# Patient Record
Sex: Female | Born: 1975 | Race: Black or African American | Hispanic: No | Marital: Married | Smoking: Never smoker
Health system: Southern US, Community
[De-identification: ages and names within clinical notes are randomized; demographics above are authoritative.]

## PROBLEM LIST (undated history)

## (undated) ENCOUNTER — Ambulatory Visit (HOSPITAL_COMMUNITY): Admission: EM | Payer: Self-pay | Source: Home / Self Care

---

## 2001-08-20 ENCOUNTER — Inpatient Hospital Stay (HOSPITAL_COMMUNITY): Admission: AD | Admit: 2001-08-20 | Discharge: 2001-08-23 | Payer: Self-pay | Admitting: Obstetrics and Gynecology

## 2001-09-21 ENCOUNTER — Other Ambulatory Visit: Admission: RE | Admit: 2001-09-21 | Discharge: 2001-09-21 | Payer: Self-pay | Admitting: Obstetrics & Gynecology

## 2002-11-08 ENCOUNTER — Other Ambulatory Visit: Admission: RE | Admit: 2002-11-08 | Discharge: 2002-11-08 | Payer: Self-pay | Admitting: Obstetrics & Gynecology

## 2003-11-14 ENCOUNTER — Other Ambulatory Visit: Admission: RE | Admit: 2003-11-14 | Discharge: 2003-11-14 | Payer: Self-pay | Admitting: Obstetrics and Gynecology

## 2003-11-14 ENCOUNTER — Ambulatory Visit (HOSPITAL_COMMUNITY): Admission: RE | Admit: 2003-11-14 | Discharge: 2003-11-14 | Payer: Self-pay | Admitting: Obstetrics and Gynecology

## 2004-06-07 ENCOUNTER — Inpatient Hospital Stay (HOSPITAL_COMMUNITY): Admission: AD | Admit: 2004-06-07 | Discharge: 2004-06-10 | Payer: Self-pay | Admitting: Obstetrics & Gynecology

## 2007-05-15 ENCOUNTER — Emergency Department (HOSPITAL_COMMUNITY): Admission: EM | Admit: 2007-05-15 | Discharge: 2007-05-15 | Payer: Self-pay | Admitting: Emergency Medicine

## 2008-03-07 ENCOUNTER — Emergency Department (HOSPITAL_COMMUNITY): Admission: EM | Admit: 2008-03-07 | Discharge: 2008-03-07 | Payer: Self-pay | Admitting: Emergency Medicine

## 2008-09-11 ENCOUNTER — Inpatient Hospital Stay (HOSPITAL_COMMUNITY): Admission: AD | Admit: 2008-09-11 | Discharge: 2008-09-11 | Payer: Self-pay | Admitting: Obstetrics and Gynecology

## 2008-09-15 ENCOUNTER — Inpatient Hospital Stay (HOSPITAL_COMMUNITY): Admission: AD | Admit: 2008-09-15 | Discharge: 2008-09-15 | Payer: Self-pay | Admitting: Obstetrics & Gynecology

## 2008-09-19 ENCOUNTER — Inpatient Hospital Stay (HOSPITAL_COMMUNITY): Admission: AD | Admit: 2008-09-19 | Discharge: 2008-09-21 | Payer: Self-pay | Admitting: Obstetrics and Gynecology

## 2008-09-20 ENCOUNTER — Encounter (INDEPENDENT_AMBULATORY_CARE_PROVIDER_SITE_OTHER): Payer: Self-pay | Admitting: Obstetrics and Gynecology

## 2009-10-30 ENCOUNTER — Emergency Department (HOSPITAL_COMMUNITY): Admission: EM | Admit: 2009-10-30 | Discharge: 2009-10-30 | Payer: Self-pay | Admitting: Emergency Medicine

## 2010-06-03 ENCOUNTER — Ambulatory Visit (HOSPITAL_COMMUNITY)
Admission: RE | Admit: 2010-06-03 | Discharge: 2010-06-03 | Disposition: A | Discharge status: Home / Self Care | Payer: BC Managed Care – PPO | Source: Ambulatory Visit | Attending: Obstetrics and Gynecology | Admitting: Obstetrics and Gynecology

## 2010-06-03 ENCOUNTER — Other Ambulatory Visit (HOSPITAL_COMMUNITY): Payer: Self-pay | Admitting: Obstetrics and Gynecology

## 2010-06-03 DIAGNOSIS — K312 Hourglass stricture and stenosis of stomach: Secondary | ICD-10-CM

## 2010-06-03 DIAGNOSIS — R1011 Right upper quadrant pain: Secondary | ICD-10-CM | POA: Insufficient documentation

## 2010-06-20 LAB — CBC
HCT: 34.8 % — ABNORMAL LOW (ref 36.0–46.0)
MCH: 28.5 pg (ref 26.0–34.0)
MCHC: 33 g/dL (ref 30.0–36.0)
MCV: 86.6 fL (ref 78.0–100.0)
RBC: 4.02 MIL/uL (ref 3.87–5.11)

## 2010-06-20 LAB — DIFFERENTIAL
Eosinophils Relative: 4 % (ref 0–5)
Monocytes Absolute: 0.8 10*3/uL (ref 0.1–1.0)
Monocytes Relative: 10 % (ref 3–12)

## 2010-06-20 LAB — URINE CULTURE
Colony Count: >=100000
Culture  Setup Time: 201107282029

## 2010-06-20 LAB — POCT URINALYSIS DIP (DEVICE)
Bilirubin Urine: NEGATIVE
Nitrite: POSITIVE — AB
Urobilinogen, UA: 0.2 mg/dL (ref 0.0–1.0)
pH: 6 (ref 5.0–8.0)

## 2010-06-20 LAB — POCT PREGNANCY, URINE: Preg Test, Ur: NEGATIVE

## 2010-07-13 LAB — CBC
HCT: 33.2 % — ABNORMAL LOW (ref 36.0–46.0)
HCT: 35.7 % — ABNORMAL LOW (ref 36.0–46.0)
Hemoglobin: 11.2 g/dL — ABNORMAL LOW (ref 12.0–15.0)
Hemoglobin: 11.9 g/dL — ABNORMAL LOW (ref 12.0–15.0)
MCHC: 33.3 g/dL (ref 30.0–36.0)
MCV: 89.2 fL (ref 78.0–100.0)
MCV: 89.3 fL (ref 78.0–100.0)
Platelets: 213 10*3/uL (ref 150–400)
RBC: 3.71 MIL/uL — ABNORMAL LOW (ref 3.87–5.11)
RBC: 4 MIL/uL (ref 3.87–5.11)
WBC: 11.4 10*3/uL — ABNORMAL HIGH (ref 4.0–10.5)

## 2010-07-13 LAB — RPR: RPR Ser Ql: NONREACTIVE

## 2010-08-18 NOTE — Op Note (Signed)
NAME:  Bailey Keller, MATZKE NO.:  [id number]   MEDICAL RECORD NO.:  [id number]          PATIENT TYPE:  INP   LOCATION:  9136                          FACILITY:  [facility]   PHYSICIAN:  Gerrit Friends. Aldona Bar, M.D.   DATE OF BIRTH:  1975-12-18   DATE OF PROCEDURE:  09/20/2008  DATE OF DISCHARGE:                               OPERATIVE REPORT   PREOPERATIVE DIAGNOSIS:  Desire for permanent elective sterilization -  postpartum.   POSTOPERATIVE DIAGNOSIS:  Desire for permanent elective sterilization -  postpartum.   PATHOLOGY:  Pending.   PROCEDURE:  Postpartum tubal sterilization procedure.   SURGEON:  Gerrit Friends. Aldona Bar, MD   ANESTHESIA:  General endotracheal.   HISTORY:  This 35 year old patient delivered on June 17, had requested a  permanent elective sterilization procedure postpartum while she was  pregnant.  Appropriate forms were signed.  Appropriate discussion was  carried out.  She is now taken to the operating according to her wishes  for postpartum tubal sterilization procedure being aware that such  procedure is meant to be a 100% permanent, but unfortunately is not a  100% perfect - subsequent pregnancy can result.   The patient was taken to the operating room after satisfactory induction  of general endotracheal anesthesia.  She was prepped and draped in usual  fashion with a Foley catheter placed as well.  Once the patient was  adequately draped, procedure was begun.   Using Allis clamps, subumbilical area was elevated and the skin incision  measuring approximately 2-1/2-3 cm was made.  Thereafter using  Metzenbaum scissors, peritoneum and fascia was appropriately opened.  The patient was placed in Trendelenburg position.  The fundus of the  uterus felt normal.  Both tubes and ovaries felt normal.  It was  possible to elevate the right fallopian tube, traced out, fimbriated for  positive implication, and then in the midportion of the right fallopian  tube the  knuckle was created and a single tie of #1 plain catgut suture  tied about the knuckle and the knuckle excised and sent to pathology.  Hemostasis was adequate.  A similar procedure was carried out on the  left fallopian tube.  At this time with good hemostasis of both tubal  ligation sites, no intra-abdominal pathology appreciated.  All  instruments were removed and closure of the abdomen was begun in layers.  The abdominal peritoneum was closed with 0 Vicryl in a running fashion.  The fascia was closed with 0 Vicryl in a interrupted fashion.  Skin was  closed with 3-0 Vicryl in subcuticular continuous fashion.  Band-Aid was  applied.  The patient at  this time was awakened and transported to the recovery room in  satisfactory addition having tolerated the procedure well.  Estimated  blood loss negligible.  All counts correct x2.  Pathologic specimen  consisted with a segment of each fallopian tube.      Gerrit Friends. Aldona Bar, M.D.  Electronically Signed     RMW/MEDQ  D:  09/20/2008  T:  09/21/2008  Job:  [id number]

## 2010-08-18 NOTE — Discharge Summary (Signed)
NAME:  Bailey Keller, Bailey Keller NO.:  [id number]   MEDICAL RECORD NO.:  [id number]          PATIENT TYPE:  INP   LOCATION:  9136                          FACILITY:  [facility]   PHYSICIAN:  Gerrit Friends. Aldona Bar, M.D.   DATE OF BIRTH:  15-Sep-1975   DATE OF ADMISSION:  09/19/2008  DATE OF DISCHARGE:  09/21/2008                               DISCHARGE SUMMARY   DISCHARGE DIAGNOSES:  1. Term pregnancy, delivered 7 pounds 7 ounce female infant, Apgars 9      and 9.  2. Blood type O+.  3. Induction of labor.  4. Desire for permanent elective sterilization.   PROCEDURES:  1. Normal spontaneous delivery.  2. Postpartum tubal sterilization procedure.   SUMMARY:  This patient, is 35 year old, gravida 4, now para 3, abortus 1  was admitted at 70 weeks with a favorable cervix for induction by Dr.  Henderson Cloud.  She delivered on the afternoon of June 17 - had a viable female  weighing 7 pounds 7 ounces with Apgars of 9 and 9 over intact perineum.  She requested a permanent elective sterilization procedure - this was  discussed both antenatally and after delivery.  Appropriate Medicaid  forms were signed and she was taken to the operating room, where under  general endotracheal anesthesia, she had a postpartum tubal  sterilization procedure on the morning of June 18.  On the morning of  June 19, she was ambulating well, tolerating a regular diet well, having  normal bowel and bladder function.  Her incision was clean and dry.  Her  fundus was firm.  Vital signs were stable and she was desirous of  discharge.  She was discharged to home with appropriate instructions and  prescription for Tylox to use 1-2 every 4-6 hours as needed for severe  pain.  She will finish up her vitamins.  Her discharge hemoglobin was  11.2 with a white count of 11,400 and platelet count of 196,000.   Circumcision was not done during the hospital admission since the  patient discovered that her primary insurance was now  Medicaid - she was  under the impression that she still had her [employer] and [employer],  but that was not the case.  Therefore, arrangements to the hospital  could not be made by the patient to allow circumcision while the patient  was hospitalized.  She will contact Dr. Arlyce Dice early next week to see if  the procedure can be done in the office.   CONDITION ON DISCHARGE:  Improved.      Gerrit Friends. Aldona Bar, M.D.  Electronically Signed     RMW/MEDQ  D:  09/21/2008  T:  09/21/2008  Job:  [id number]

## 2010-12-10 ENCOUNTER — Other Ambulatory Visit: Payer: Self-pay | Admitting: Obstetrics and Gynecology

## 2011-01-08 LAB — DIFFERENTIAL
Basophils Absolute: 0 10*3/uL (ref 0.0–0.1)
Basophils Relative: 0 % (ref 0–1)
Eosinophils Absolute: 0.2 10*3/uL (ref 0.0–0.7)
Eosinophils Relative: 2 % (ref 0–5)
Lymphocytes Relative: 9 % — ABNORMAL LOW (ref 12–46)
Neutrophils Relative %: 84 % — ABNORMAL HIGH (ref 43–77)

## 2011-01-08 LAB — COMPREHENSIVE METABOLIC PANEL
ALT: 16 U/L (ref 0–35)
Albumin: 3.8 g/dL (ref 3.5–5.2)
CO2: 23 mEq/L (ref 19–32)
Calcium: 9.3 mg/dL (ref 8.4–10.5)
Chloride: 105 mEq/L (ref 96–112)
Creatinine, Ser: 0.48 mg/dL (ref 0.4–1.2)
GFR calc non Af Amer: >60 mL/min (ref >60)
Glucose, Bld: 93 mg/dL (ref 70–99)
Potassium: 4.4 mEq/L (ref 3.5–5.1)
Total Protein: 7 g/dL (ref 6.0–8.3)

## 2011-01-08 LAB — URINALYSIS, ROUTINE W REFLEX MICROSCOPIC
Bilirubin Urine: NEGATIVE
Glucose, UA: NEGATIVE mg/dL
Ketones, ur: 15 mg/dL — AB
Nitrite: NEGATIVE
Protein, ur: NEGATIVE mg/dL
Urobilinogen, UA: 0.2 mg/dL (ref 0.0–1.0)

## 2011-01-08 LAB — GC/CHLAMYDIA PROBE AMP, GENITAL: GC Probe Amp, Genital: NEGATIVE

## 2011-01-08 LAB — CBC
HCT: 36.7 % (ref 36.0–46.0)
MCV: 86.4 fL (ref 78.0–100.0)
RBC: 4.25 MIL/uL (ref 3.87–5.11)

## 2011-01-08 LAB — WET PREP, GENITAL: Clue Cells Wet Prep HPF POC: NONE SEEN

## 2011-01-08 LAB — URINE MICROSCOPIC-ADD ON

## 2011-08-24 IMAGING — US US ABDOMEN COMPLETE
1 series · 14 of 25 positions shown · non-contrast
Comparison: OB ultrasound 11/14/2003

CLINICAL DATA: Right upper quadrant pain.  History of
pyelonephritis.

ABDOMINAL ULTRASOUND COMPLETE

[Series 1: us abdomen complete · 14 of 67 slices shown]
[im 1/67]
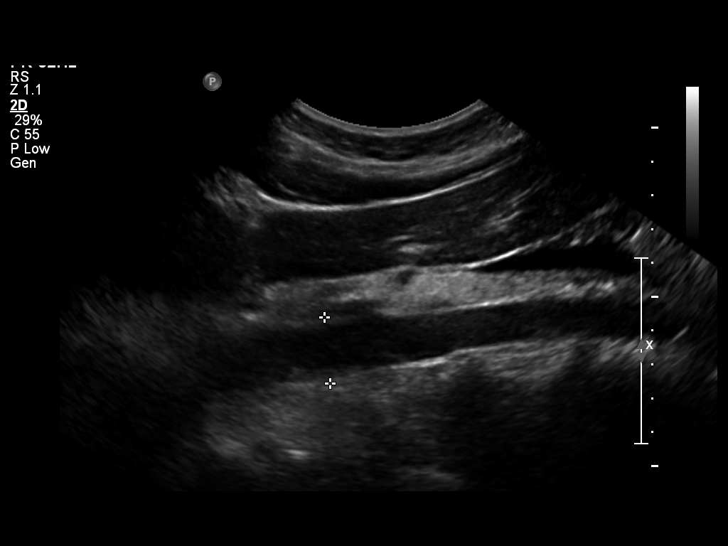
[im 6/67]
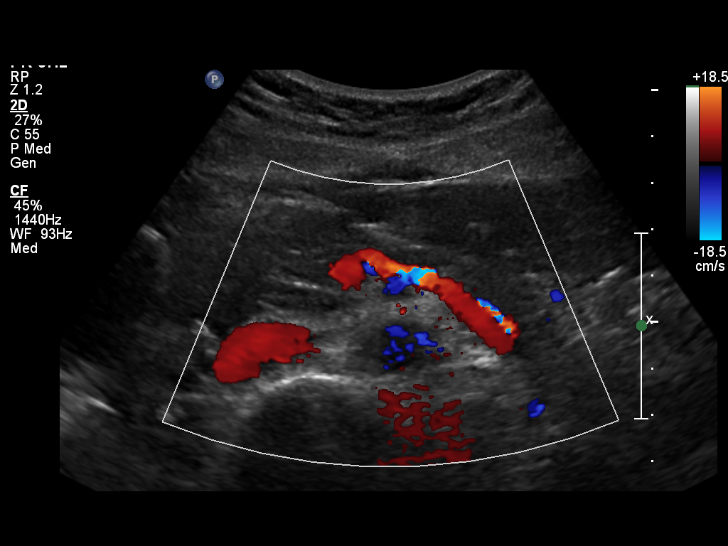
[im 12/67]
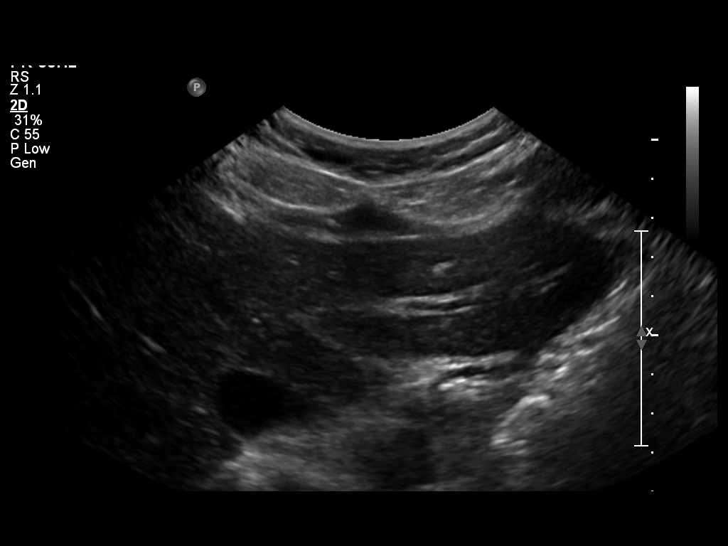
[im 17/67]
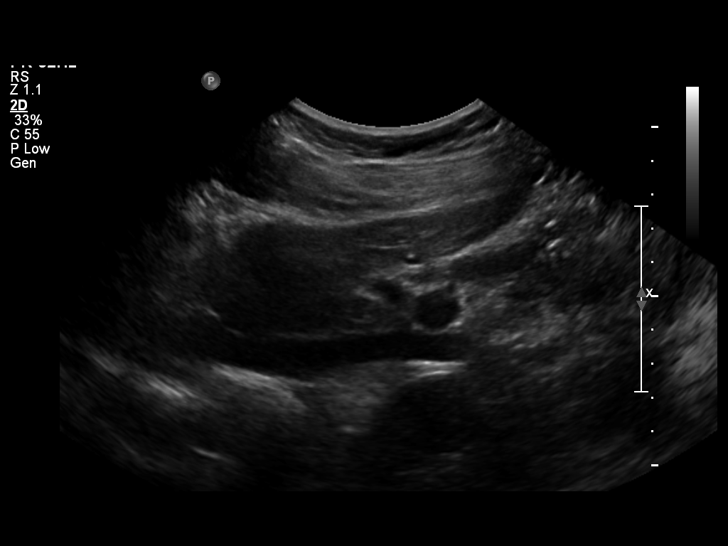
[im 23/67]
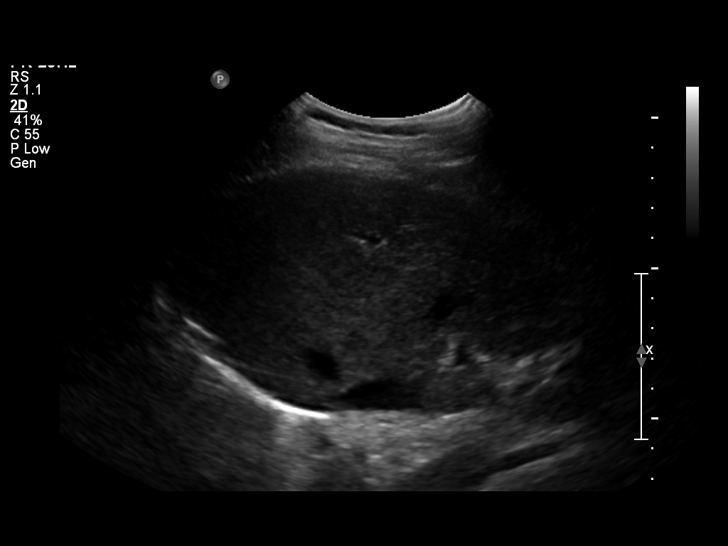
[im 25/67]
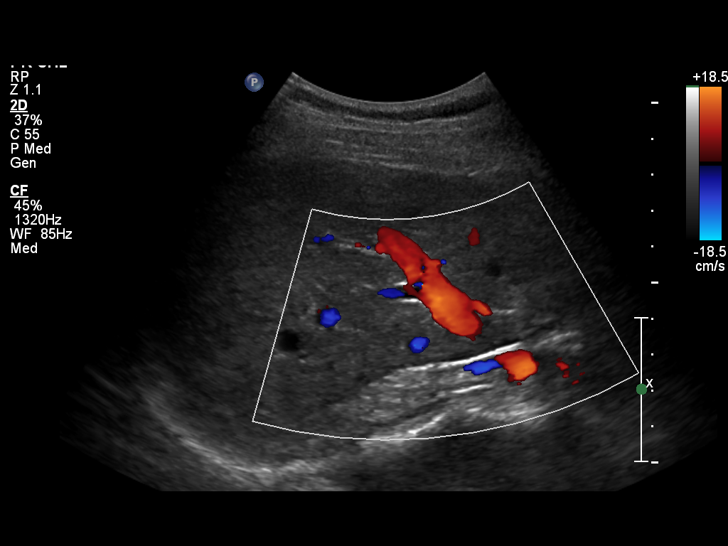
[im 31/67]
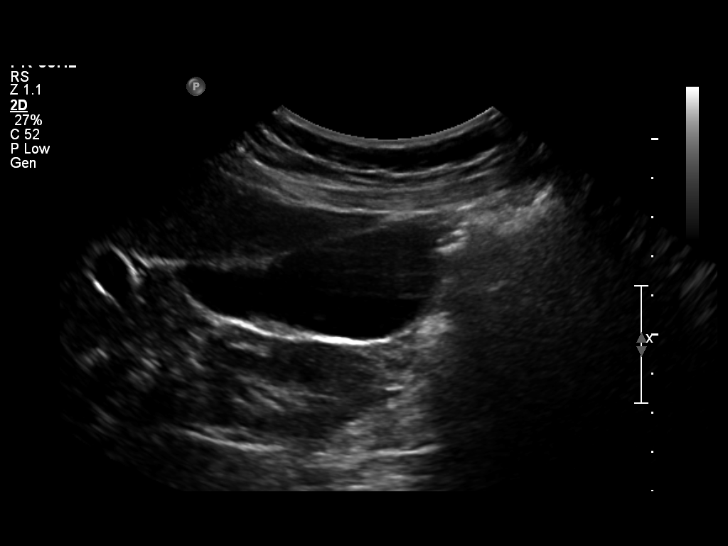
[im 36/67]
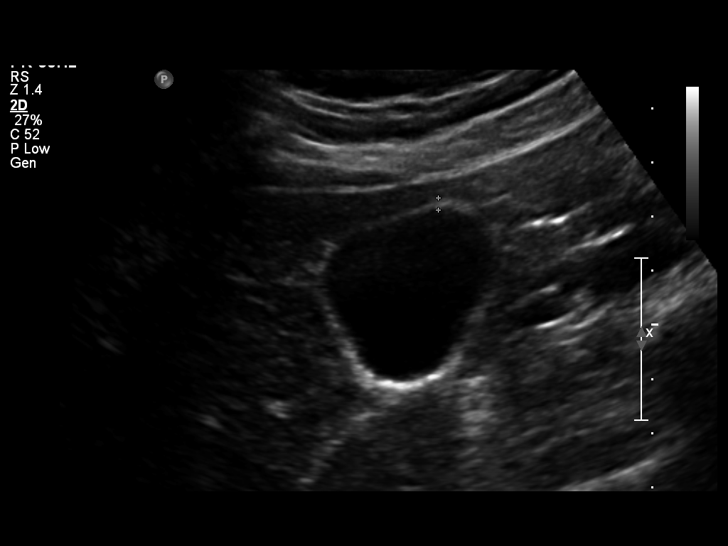
[im 42/67]
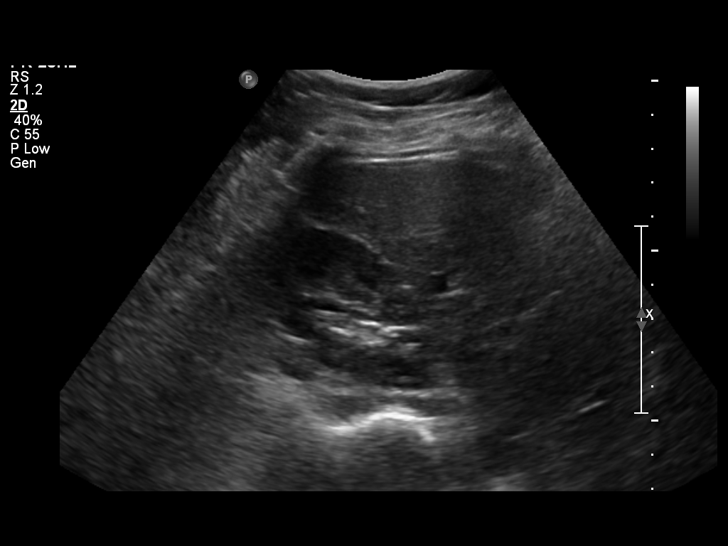
[im 45/67]
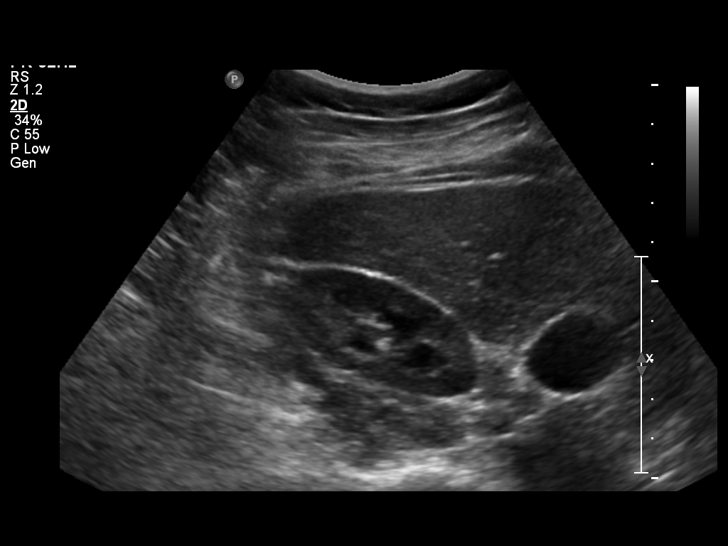
[im 50/67]
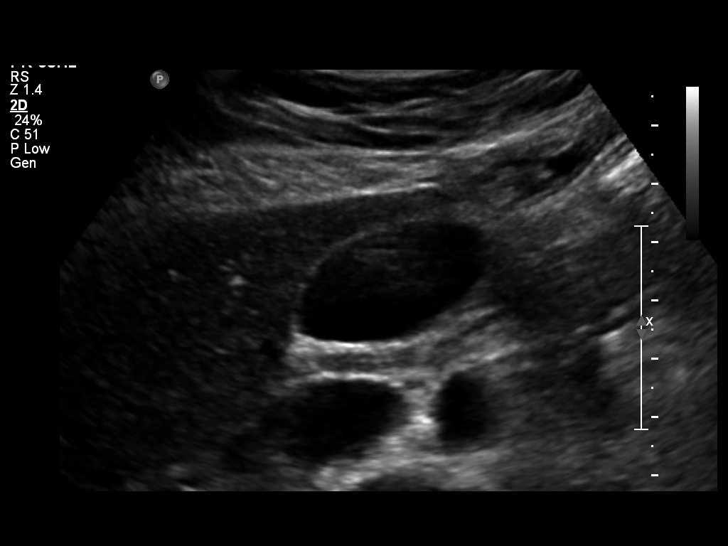
[im 56/67]
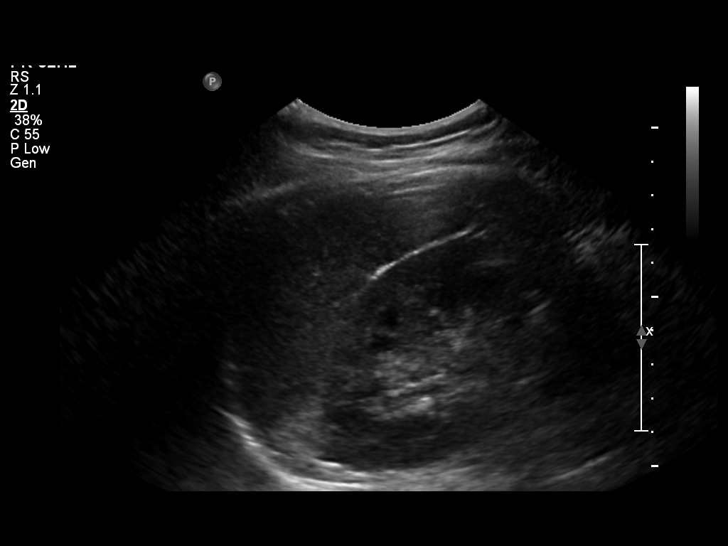
[im 61/67]
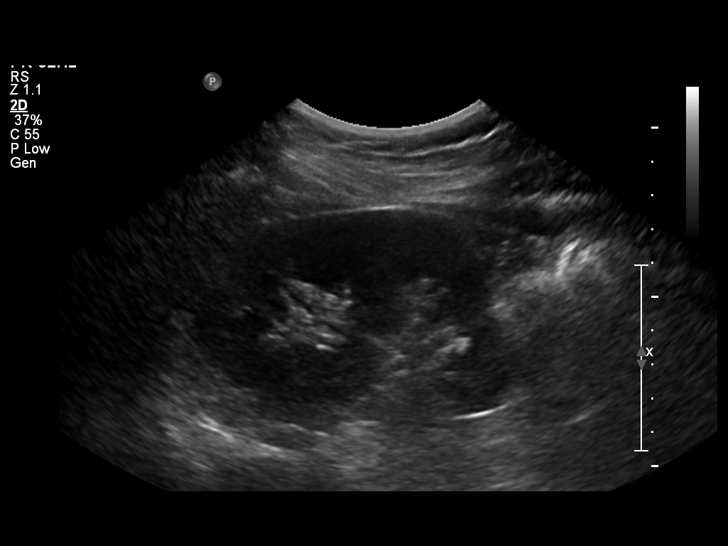
[im 67/67]
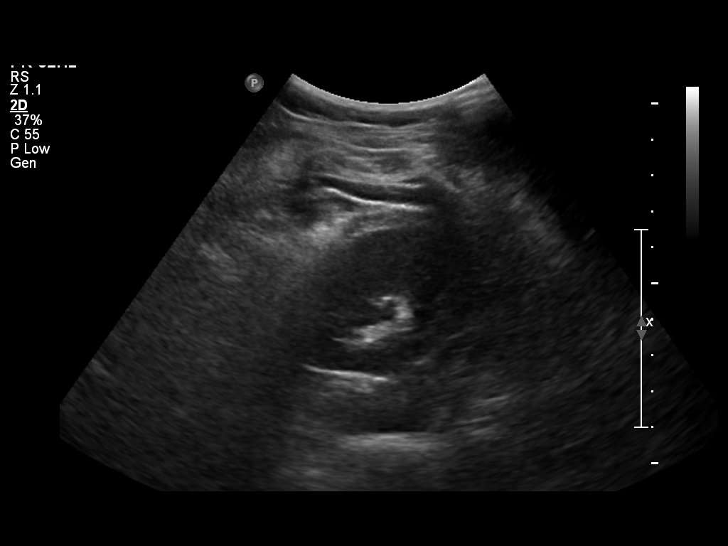

[14 of 25 positions shown; findings below may reference images not displayed]

FINDINGS: Gallbladder:  No gallstones, gallbladder wall thickening, or
pericholecystic fluid.

Common Bile Duct:  Within normal limits in caliber.

Liver: No focal mass lesion identified.  Within normal limits in
parenchymal echogenicity.

IVC:  Appears normal.

Pancreas:  No abnormality identified.

Spleen:  Within normal limits in size and echotexture.

Right kidney:  Normal in size and parenchymal echogenicity.  No
evidence of mass or hydronephrosis.

Left kidney:  Normal in size and parenchymal echogenicity.  No
evidence of mass or hydronephrosis.

Abdominal Aorta:  No aneurysm identified.
IMPRESSION: Negative abdominal ultrasound.

## 2012-10-09 ENCOUNTER — Other Ambulatory Visit: Payer: Self-pay | Admitting: Obstetrics and Gynecology

## 2013-10-11 ENCOUNTER — Other Ambulatory Visit: Payer: Self-pay | Admitting: Obstetrics and Gynecology

## 2013-10-15 LAB — CYTOLOGY - PAP

## 2014-11-04 ENCOUNTER — Other Ambulatory Visit: Payer: Self-pay | Admitting: Obstetrics and Gynecology

## 2014-11-05 LAB — CYTOLOGY - PAP

## 2022-10-06 ENCOUNTER — Other Ambulatory Visit: Payer: Self-pay

## 2022-10-06 ENCOUNTER — Emergency Department (HOSPITAL_COMMUNITY): Payer: Self-pay

## 2022-10-06 ENCOUNTER — Emergency Department (HOSPITAL_COMMUNITY)
Admission: EM | Admit: 2022-10-06 | Discharge: 2022-10-07 | Disposition: A | Discharge status: Home / Self Care | Payer: Worker's Compensation | Attending: Emergency Medicine | Admitting: Emergency Medicine

## 2022-10-06 ENCOUNTER — Encounter (HOSPITAL_COMMUNITY): Payer: Self-pay | Admitting: Emergency Medicine

## 2022-10-06 DIAGNOSIS — S61217A Laceration without foreign body of left little finger without damage to nail, initial encounter: Secondary | ICD-10-CM | POA: Diagnosis not present

## 2022-10-06 DIAGNOSIS — Z23 Encounter for immunization: Secondary | ICD-10-CM | POA: Insufficient documentation

## 2022-10-06 DIAGNOSIS — W290XXA Contact with powered kitchen appliance, initial encounter: Secondary | ICD-10-CM | POA: Insufficient documentation

## 2022-10-06 MED ORDER — HYDROCODONE-ACETAMINOPHEN 5-325 MG PO TABS
1.0000 | ORAL_TABLET | Freq: Once | ORAL | Status: AC
  Administered 2022-10-06: 1 via ORAL
  Filled 2022-10-06: qty 1

## 2022-10-06 MED ORDER — TETANUS-DIPHTH-ACELL PERTUSSIS 5-2.5-18.5 LF-MCG/0.5 IM SUSY
0.5000 mL | PREFILLED_SYRINGE | Freq: Once | INTRAMUSCULAR | Status: AC
  Administered 2022-10-06: 0.5 mL via INTRAMUSCULAR
  Filled 2022-10-06: qty 0.5

## 2022-10-06 NOTE — ED Triage Notes (Signed)
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 Bleeding controlled. Dressing applied in triage. Unsure of last tetanus.

## 2022-10-07 MED ORDER — CEPHALEXIN 500 MG PO CAPS: 500.0000 mg | ORAL_CAPSULE | Freq: Two times a day (BID) | ORAL | 0 refills | Status: AC

## 2022-10-07 MED ORDER — LIDOCAINE HCL (PF) 1 % IJ SOLN
5.0000 mL | Freq: Once | INTRAMUSCULAR | Status: AC
  Administered 2022-10-07: 5 mL
  Filled 2022-10-07: qty 5

## 2022-10-07 MED ORDER — OXYCODONE-ACETAMINOPHEN 5-325 MG PO TABS: 1.0000 | ORAL_TABLET | Freq: Three times a day (TID) | ORAL | 0 refills | Status: AC | PRN

## 2022-10-07 NOTE — ED Provider Notes (Signed)
[place] EMERGENCY DEPARTMENT AT [facility] Provider Note   CSN: [id number] Arrival date & time: 10/06/22  1707     History  Chief Complaint  Patient presents with   Laceration    Bailey Keller is a 47 y.o. female.  HPI   Without significant medical history presenting with complaints of laceration to the left pinky finger, happened around 5 PM yesterday, states she was using a meat slicer and accidentally cut her pinky finger, she denies any paresthesias or weakness in that finger, is able to move at all joints, she is not immunocompromise, but is not up-to-date on her tetanus shot, she has no other complaints.  Home Medications Prior to Admission medications   Medication Sig Start Date End Date Taking? Authorizing Provider  cephALEXin (KEFLEX) 500 MG capsule Take 1 capsule (500 mg total) by mouth 2 (two) times daily for 5 days. 10/07/22 10/12/22 Yes Carroll Sage, PA-C  oxyCODONE-acetaminophen (PERCOCET/ROXICET) 5-325 MG tablet Take 1 tablet by mouth every 8 (eight) hours as needed for up to 3 days for severe pain. 10/07/22 10/10/22 Yes Carroll Sage, PA-C      Allergies    Codeine    Review of Systems   Review of Systems  Constitutional:  Negative for chills and fever.  Respiratory:  Negative for shortness of breath.   Cardiovascular:  Negative for chest pain.  Gastrointestinal:  Negative for abdominal pain.  Skin:  Positive for wound.  Neurological:  Negative for headaches.    Physical Exam Updated Vital Signs BP 129/87 (BP Location: Right Arm)   Pulse (!) 20   Temp 97.8 F (36.6 C) (Oral)   Resp 20   Ht 5\' 2"  (1.575 m)   Wt 59 kg   SpO2 100%   BMI 23.78 kg/m  Physical Exam Vitals and nursing note reviewed.  Constitutional:      General: She is not in acute distress.    Appearance: She is not ill-appearing.  HENT:     Head: Normocephalic and atraumatic.     Nose: No congestion.  Eyes:     Conjunctiva/sclera: Conjunctivae normal.   Cardiovascular:     Rate and Rhythm: Normal rate and regular rhythm.     Pulses: Normal pulses.  Pulmonary:     Effort: Pulmonary effort is normal.  Musculoskeletal:     Comments: Focused exam of the left hand reveals a avulsion like laceration on the distal lateral aspect of the pinky finger, measures approximately 2 cm in length, approximately 3 mm in depth, no ligament damage no foreign bodies present, able to move at all joints in that pinky finger, 2-second capillary refill, sensation intact to light touch  Skin:    General: Skin is warm and dry.  Neurological:     Mental Status: She is alert.  Psychiatric:        Mood and Affect: Mood normal.     ED Results / Procedures / Treatments   Labs (all labs ordered are listed, but only abnormal results are displayed) Labs Reviewed - No data to display  EKG None  Radiology DG Finger Little Left  Result Date: 10/07/2022 CLINICAL DATA:  Laceration to left pinky finger. EXAM: LEFT FINGER(S) - 2+ VIEW COMPARISON:  None Available. FINDINGS: There is no evidence of fracture or dislocation. There is no evidence of arthropathy or other focal bone abnormality. Bandage material is noted at the distal fifth digit. No radiopaque foreign body is seen. Soft tissues are unremarkable. IMPRESSION:  No acute osseous abnormality or radiopaque foreign body. Electronically Signed   By: Thornell Sartorius M.D.   On: 10/07/2022 00:23    Procedures .Marland KitchenLaceration Repair  Date/Time: 10/07/2022 6:31 AM  Performed by: Carroll Sage, PA-C Authorized by: Carroll Sage, PA-C   Consent:    Consent obtained:  Verbal   Consent given by:  Patient   Risks, benefits, and alternatives were discussed: yes     Risks discussed:  Infection, pain, retained foreign body, poor cosmetic result, need for additional repair, nerve damage, poor wound healing and vascular damage   Alternatives discussed:  No treatment Universal protocol:    Patient identity confirmed:   Verbally with patient Anesthesia:    Anesthesia method:  Nerve block   Block needle gauge:  24 G   Block anesthetic:  Lidocaine 1% w/o epi   Block injection procedure:  Introduced needle   Block outcome:  Anesthesia achieved Laceration details:    Location:  Finger   Finger location:  L small finger   Length (cm):  2   Depth (mm):  3 Exploration:    Imaging outcome: foreign body not noted     Wound exploration: entire depth of wound visualized   Treatment:    Area cleansed with:  Saline   Amount of cleaning:  Standard   Irrigation solution:  Sterile saline   Irrigation method:  Pressure wash Skin repair:    Repair method:  Sutures   Suture size:  5-0   Suture material:  Prolene   Suture technique:  Simple interrupted   Number of sutures:  4 Approximation:    Approximation:  Loose Repair type:    Repair type:  Simple Post-procedure details:    Dressing:  Non-adherent dressing   Procedure completion:  Tolerated well, no immediate complications     Medications Ordered in ED Medications  Tdap (BOOSTRIX) injection 0.5 mL (0.5 mLs Intramuscular Given 10/06/22 2332)  HYDROcodone-acetaminophen (NORCO/VICODIN) 5-325 MG per tablet 1 tablet (1 tablet Oral Given 10/06/22 2337)  lidocaine (PF) (XYLOCAINE) 1 % injection 5 mL (5 mLs Infiltration Given 10/07/22 0110)    ED Course/ Medical Decision Making/ A&P                             Medical Decision Making Risk Prescription drug management.   This patient presents to the ED for concern of laceration, this involves an extensive number of treatment options, and is a complaint that carries with it a high risk of complications and morbidity.  The differential diagnosis includes fracture, ligament damage, foreign body, vascular injury    Additional history obtained:  Additional history obtained from husband at bedside hide External records from outside source obtained and reviewed including neurology notes   Co morbidities that  complicate the patient evaluation  N/A  Social Determinants of Health:  N/A    Lab Tests:  I Ordered, and personally interpreted labs.  The pertinent results include: N/A   Imaging Studies ordered:  I ordered imaging studies including x-ray I independently visualized and interpreted imaging which showed negative acute findings I agree with the radiologist interpretation   Cardiac Monitoring:  The patient was maintained on a cardiac monitor.  I personally viewed and interpreted the cardiac monitored which showed an underlying rhythm of: N/A   Medicines ordered and prescription drug management:  I ordered medication including Tdap, lidocaine I have reviewed the patients home medicines and have made adjustments  as needed  Critical Interventions:  N/a   Reevaluation:  Presents with laceration to left pinky finger, will obtain imaging, update tetanus shot provided pain medication  Updated patient on imaging it was unremarkable, recommend suturing for improved wound healing patient agreed this plan tolerated well agree with discharge at this time.    Consultations Obtained:  N/a    Test Considered:  N/a    Rule out Low suspicion for fracture or dislocation as x-ray does not reveal any acute findings. low suspicion for  tendon damage as area was palpated no gross defects noted she had full range of motion in his left pinky finger.  Low suspicion for compartment syndrome as area was palpated it was soft to the touch, neurovascular fully intact before and after the procedure     Dispostion and problem list  After consideration of the diagnostic results and the patients response to treatment, I feel that the patent would benefit from discharge.  Laceration-will recommend basic wound care, start antibiotics, follow-up next 8 to 9 days for suture removal.            Final Clinical Impression(s) / ED Diagnoses Final diagnoses:  Laceration of left  little finger without foreign body without damage to nail, initial encounter    Rx / DC Orders ED Discharge Orders          Ordered    cephALEXin (KEFLEX) 500 MG capsule  2 times daily        10/07/22 0214    oxyCODONE-acetaminophen (PERCOCET/ROXICET) 5-325 MG tablet  Every 8 hours PRN        10/07/22 0214              Carroll Sage, PA-C 10/07/22 2956    Shon Baton, MD 10/08/22 0230

## 2022-10-07 NOTE — Discharge Instructions (Signed)
Small laceration received 3 sutures, please refrain from getting wet for today, starting tomorrow please rinse out the wound and apply new dressings, do this twice daily.  May use over-the-counter pain medication as needed.  To help decrease scarring please stay out of direct sunlight for the first 6 weeks.  I have started you on antibiotics take as prescribed.  I have given you a short course of narcotics please take as prescribed.  This medication can make you drowsy do not consume alcohol or operate heavy machinery when taking this medication.  This medication is Tylenol in it do not take Tylenol and take this medication.     Follow-up next 8-10 days for suture removal either at this department urgent care or your primary care provider.  If you notice worsening redness swelling discharge or worsening pain in the area please come back in for reassessment

## 2023-06-02 ENCOUNTER — Ambulatory Visit: Payer: 59 | Admitting: Podiatry

## 2023-06-02 ENCOUNTER — Ambulatory Visit (INDEPENDENT_AMBULATORY_CARE_PROVIDER_SITE_OTHER): Payer: 59

## 2023-06-02 DIAGNOSIS — G8929 Other chronic pain: Secondary | ICD-10-CM | POA: Diagnosis not present

## 2023-06-02 DIAGNOSIS — M778 Other enthesopathies, not elsewhere classified: Secondary | ICD-10-CM

## 2023-06-02 DIAGNOSIS — M792 Neuralgia and neuritis, unspecified: Secondary | ICD-10-CM | POA: Diagnosis not present

## 2023-06-02 DIAGNOSIS — M79671 Pain in right foot: Secondary | ICD-10-CM | POA: Diagnosis not present

## 2023-06-02 NOTE — Progress Notes (Signed)
 Chief Complaint  Patient presents with   Foot Pain    NP right foot pain. Pain is in the heel and arch with calf cramps. She states it burns, aches and tender to the touch. There are some bruises both latterally and medial. States tylenol , aleive, flexerill and gabapent does not help. She was treated at urgent care 2 weeks ago and they put her in a walking boot, but she is not wearing it today. Xrays are up. She also state that she was treated 5 years ago with another podiatrist with no good relief. She is not diabetic and no anti coags.    HPI: 48 y.o. female presents today with concern of chronic pain, tingling, calf cramping of the right lower extremity, including the foot.  States that this has been an ongoing issue for the past several years.  States that she has had MRIs performed of her lower back and feet which she states were negative for pathology.  As she feels that she has increased bruising to the right foot.  She points to 2 areas and notes that there is bruising, however I do not see any obvious ecchymosis when she points to the lateral midfoot.  States that she has been on several rounds of NSAIDs and has tried gabapentin, with no relief.  Denies lower back injury.  States this this has always been her only on the right lower extremity.  Denies history of stroke  History reviewed. No pertinent past medical history.  History reviewed. No pertinent surgical history.  Allergies  Allergen Reactions   Codeine Other (See Comments)    Hallucinations   Other Rash    Review of Systems  Neurological:  Positive for tingling and sensory change.     Physical Exam: General: The patient is alert and oriented x3 in no acute distress.  Dermatology: Skin is warm, dry and supple bilateral lower extremities. Interspaces are clear of maceration and debris.  No mottling of the skin is noted bilateral.   Vascular: Palpable pedal pulses bilaterally. Capillary refill within normal limits.  No  appreciable edema.  No erythema or calor.  Proximal to distal cooling is within normal limits.  Neurological: She tested positive for L5-S2 dermatomes with having decreased protective sensation and decreased vibratory sensation on the right foot.  Temperature sensation is intact bilateral.    Orthopedic: There is significant tenderness on palpation of the entire right ankle and foot.  Patient does not tolerate even light palpation very well.  Joint range of motion could not be completed secondary to generalized pain.  Radiographic Exam:  Normal osseous mineralization.  Joint space narrowing at the first MPJ.  Bipartite tibial sesamoid.  Otherwise unremarkable x-ray exam.  No fracture seen  Assessment/Plan of Care: 1. Neuralgia and neuritis   2. Capsulitis of right foot   3. Pain in right foot   4. Chronic foot pain, right    AMB REFERRAL TO NEUROLOGY  Discussed clinical and radiographic findings with patient today.  Informed the patient that she had an abnormal neurologic exam to the right lower extremity today.  This involves several dermatomes which makes it less likely that there is a specific issue in the foot.  There is concern of possible RSD/chronic regional pain syndrome.  I will refer to neurology for evaluation by neurologist and EMG/NCV of the lower extremity.  The only other imaging study that may shed some light could be a triple phase bone scan.  If the left foot  has increased uptake in the typical area such as the first MPJ but the right foot is "cold" on this image, it could aid in confirmation of suspected diagnosis.  Follow-up if needed after neurological consult.   Clerance Lav, DPM, FACFAS [facility]     [address] [place], [place] [address]                Office [phone]  Fax [phone]

## 2023-06-05 ENCOUNTER — Encounter: Payer: Self-pay | Admitting: Podiatry

## 2023-06-07 ENCOUNTER — Encounter: Payer: Self-pay | Admitting: Neurology

## 2023-06-08 ENCOUNTER — Emergency Department (HOSPITAL_BASED_OUTPATIENT_CLINIC_OR_DEPARTMENT_OTHER)

## 2023-06-08 ENCOUNTER — Emergency Department (HOSPITAL_BASED_OUTPATIENT_CLINIC_OR_DEPARTMENT_OTHER)
Admission: EM | Admit: 2023-06-08 | Discharge: 2023-06-08 | Disposition: A | Discharge status: Home / Self Care | Attending: Emergency Medicine | Admitting: Emergency Medicine

## 2023-06-08 ENCOUNTER — Encounter (HOSPITAL_BASED_OUTPATIENT_CLINIC_OR_DEPARTMENT_OTHER): Payer: Self-pay | Admitting: Emergency Medicine

## 2023-06-08 DIAGNOSIS — R55 Syncope and collapse: Secondary | ICD-10-CM | POA: Diagnosis present

## 2023-06-08 DIAGNOSIS — J101 Influenza due to other identified influenza virus with other respiratory manifestations: Secondary | ICD-10-CM | POA: Diagnosis not present

## 2023-06-08 DIAGNOSIS — E876 Hypokalemia: Secondary | ICD-10-CM | POA: Insufficient documentation

## 2023-06-08 DIAGNOSIS — R519 Headache, unspecified: Secondary | ICD-10-CM

## 2023-06-08 DIAGNOSIS — D649 Anemia, unspecified: Secondary | ICD-10-CM | POA: Diagnosis not present

## 2023-06-08 LAB — URINALYSIS, ROUTINE W REFLEX MICROSCOPIC
Bilirubin Urine: NEGATIVE
Glucose, UA: NEGATIVE mg/dL
Hgb urine dipstick: NEGATIVE
Ketones, ur: NEGATIVE mg/dL
Leukocytes,Ua: NEGATIVE
Nitrite: NEGATIVE
Protein, ur: NEGATIVE mg/dL
Specific Gravity, Urine: 1.01 (ref 1.005–1.030)
pH: 6 (ref 5.0–8.0)

## 2023-06-08 LAB — CBC
HCT: 36.2 % (ref 36.0–46.0)
Hemoglobin: 11.7 g/dL — ABNORMAL LOW (ref 12.0–15.0)
MCH: 27.3 pg (ref 26.0–34.0)
MCHC: 32.3 g/dL (ref 30.0–36.0)
MCV: 84.4 fL (ref 80.0–100.0)
Platelets: 305 10*3/uL (ref 150–400)
RBC: 4.29 MIL/uL (ref 3.87–5.11)
RDW: 14.3 % (ref 11.5–15.5)
WBC: 7.2 10*3/uL (ref 4.0–10.5)
nRBC: 0 % (ref 0.0–0.2)

## 2023-06-08 LAB — BASIC METABOLIC PANEL
Anion gap: 9 (ref 5–15)
BUN: 8 mg/dL (ref 6–20)
CO2: 18 mmol/L — ABNORMAL LOW (ref 22–32)
Calcium: 7.9 mg/dL — ABNORMAL LOW (ref 8.9–10.3)
Chloride: 108 mmol/L (ref 98–111)
Creatinine, Ser: 0.55 mg/dL (ref 0.44–1.00)
GFR, Estimated: >60 mL/min (ref >60)
Glucose, Bld: 93 mg/dL (ref 70–99)
Potassium: 2.9 mmol/L — ABNORMAL LOW (ref 3.5–5.1)
Sodium: 135 mmol/L (ref 135–145)

## 2023-06-08 LAB — RESP PANEL BY RT-PCR (RSV, FLU A&B, COVID)  RVPGX2
Influenza A by PCR: POSITIVE — AB
Influenza B by PCR: NEGATIVE
Resp Syncytial Virus by PCR: NEGATIVE
SARS Coronavirus 2 by RT PCR: NEGATIVE

## 2023-06-08 LAB — TROPONIN I (HIGH SENSITIVITY)
Troponin I (High Sensitivity): <2 ng/L (ref <18)
Troponin I (High Sensitivity): <2 ng/L (ref <18)

## 2023-06-08 LAB — PREGNANCY, URINE: Preg Test, Ur: NEGATIVE

## 2023-06-08 LAB — MAGNESIUM: Magnesium: 1.8 mg/dL (ref 1.7–2.4)

## 2023-06-08 MED ORDER — ONDANSETRON 4 MG PO TBDP: 4.0000 mg | ORAL_TABLET | Freq: Three times a day (TID) | ORAL | 0 refills | Status: DC | PRN

## 2023-06-08 MED ORDER — METOCLOPRAMIDE HCL 5 MG/ML IJ SOLN
10.0000 mg | Freq: Once | INTRAMUSCULAR | Status: AC
  Administered 2023-06-08: 10 mg via INTRAVENOUS
  Filled 2023-06-08: qty 2

## 2023-06-08 MED ORDER — KETOROLAC TROMETHAMINE 15 MG/ML IJ SOLN
15.0000 mg | Freq: Once | INTRAMUSCULAR | Status: AC
  Administered 2023-06-08: 15 mg via INTRAVENOUS
  Filled 2023-06-08: qty 1

## 2023-06-08 MED ORDER — DIPHENHYDRAMINE HCL 50 MG/ML IJ SOLN
25.0000 mg | Freq: Once | INTRAMUSCULAR | Status: AC
  Administered 2023-06-08: 25 mg via INTRAVENOUS
  Filled 2023-06-08: qty 1

## 2023-06-08 MED ORDER — SODIUM CHLORIDE 0.9 % IV BOLUS
1000.0000 mL | Freq: Once | INTRAVENOUS | Status: AC
  Administered 2023-06-08: 1000 mL via INTRAVENOUS

## 2023-06-08 MED ORDER — POTASSIUM CHLORIDE CRYS ER 20 MEQ PO TBCR
40.0000 meq | EXTENDED_RELEASE_TABLET | Freq: Once | ORAL | Status: AC
  Administered 2023-06-08: 40 meq via ORAL
  Filled 2023-06-08: qty 2

## 2023-06-08 NOTE — ED Provider Notes (Signed)
 [facility] Provider Note   CSN: [id number] Arrival date & time: 06/08/23  1352     History  Chief Complaint  Patient presents with   Loss of Consciousness    Bailey Keller is a 48 y.o. female.   Loss of Consciousness   48 year old female presents emergency department with complaints of syncopal episode.  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  States that she subsequently collapsed to the ground and was lowered down to the floor.  States that she lost consciousness for a few seconds before regaining consciousness.  Her coworkers called EMS who gave her "sugar tablet" as well as some nausea medicine and repeat.  Patient states that she has been dealing with sinus pressure for the past couple of weeks.  States that she has been having chronic headache in her frontal region.  Denies any visual disturbance, gait abnormality, slurred speech, facial droop, weakness/sensory deficits in upper or lower extremities.  States that she was on antibiotics for a prior sinus infection around 4 weeks ago states that it helped some but never fully went away.  Denies any chest pain, shortness of breath, fevers, chills.  Patient does report that intermittent episodes of diarrhea over the past couple of weeks as well as a few episodes of vomiting; states she is concerned about dehydration as well.  No significant pertinent past medical history.  Home Medications Prior to Admission medications   Medication Sig Start Date End Date Taking? Authorizing Provider  ondansetron (ZOFRAN-ODT) 4 MG disintegrating tablet Take 1 tablet (4 mg total) by mouth every 8 (eight) hours as needed. 06/08/23  Yes Sherian Maroon A, PA  albuterol (VENTOLIN HFA) 108 (90 Base) MCG/ACT inhaler Inhale into the lungs. 05/24/18   [provider]  EPINEPHrine 0.3 mg/0.3 mL  IJ SOAJ injection Inject into the muscle. 05/24/18   [provider]      Allergies    Codeine and Other    Review of Systems   Review of Systems  Cardiovascular:  Positive for syncope.  All other systems reviewed and are negative.   Physical Exam Updated Vital Signs BP 117/79 (BP Location: Right Arm)   Pulse 74   Temp 98 F (36.7 C) (Oral)   Resp 20   Ht 5' (1.524 m)   Wt 59 kg   LMP 05/25/2023   SpO2 100%   BMI 25.39 kg/m  Physical Exam Vitals and nursing note reviewed.  Constitutional:      General: She is not in acute distress.    Appearance: She is well-developed.  HENT:     Head: Normocephalic and atraumatic.     Comments: Maxillary and frontal sinus tenderness to palpation.    Nose: Congestion and rhinorrhea present.     Mouth/Throat:     Mouth: Mucous membranes are moist.     Pharynx: Oropharynx is clear. No posterior oropharyngeal erythema.  Eyes:     Conjunctiva/sclera: Conjunctivae normal.  Cardiovascular:     Rate and Rhythm: Normal rate and regular rhythm.     Heart sounds: No murmur heard. Pulmonary:     Effort: Pulmonary effort is normal. No respiratory distress.     Breath sounds: Normal breath sounds. No wheezing, rhonchi or rales.  Abdominal:     Palpations: Abdomen is soft.     Tenderness: There is no abdominal tenderness.  Musculoskeletal:        General: No swelling.     Cervical back: Neck supple.     Right lower leg: No edema.     Left lower leg: No edema.  Skin:    General: Skin is warm and dry.     Capillary Refill: Capillary refill takes less than 2 seconds.  Neurological:     Mental Status: She is alert.     Comments: Alert and oriented to self, place, time and event.   Speech is fluent, clear without dysarthria or dysphasia.   Strength 5/5 in upper/lower extremities   Sensation intact in upper/lower extremities   Normal gait.  CN I not tested  CN II not tested CN III, IV, VI PERRLA and EOMs intact bilaterally  CN  V Intact sensation to sharp and light touch to the face  CN VII facial movements symmetric  CN VIII not tested  CN IX, X no uvula deviation, symmetric rise of soft palate  CN XI 5/5 SCM and trapezius strength bilaterally  CN XII Midline tongue protrusion, symmetric L/R movements     Psychiatric:        Mood and Affect: Mood normal.     ED Results / Procedures / Treatments   Labs (all labs ordered are listed, but only abnormal results are displayed) Labs Reviewed  RESP PANEL BY RT-PCR (RSV, FLU A&B, COVID)  RVPGX2 - Abnormal; Notable for the following components:      Result Value   Influenza A by PCR POSITIVE (*)    All other components within normal limits  BASIC METABOLIC PANEL - Abnormal; Notable for the following components:   Potassium 2.9 (*)    CO2 18 (*)    Calcium 7.9 (*)    All other components within normal limits  CBC - Abnormal; Notable for the following components:   Hemoglobin 11.7 (*)    All other components within normal limits  URINALYSIS, ROUTINE W REFLEX MICROSCOPIC - Abnormal; Notable for the following components:   Color, Urine STRAW (*)    All other components within normal limits  MAGNESIUM  PREGNANCY, URINE  TROPONIN I (HIGH SENSITIVITY)  TROPONIN I (HIGH SENSITIVITY)    EKG EKG Interpretation Date/Time:  Wednesday June 08 2023 14:11:37 EST Ventricular Rate:  63 PR Interval:  178 QRS Duration:  74 QT Interval:  416 QTC Calculation: 425 R Axis:   93  Text Interpretation: Normal sinus rhythm Rightward axis Confirmed by Virgina Norfolk [phone]) on 06/08/2023 3:04:00 PM  Radiology CT Head Wo Contrast Result Date: 06/08/2023 CLINICAL DATA:  Worsening headache and dizziness.  Syncopal episode. EXAM: CT HEAD WITHOUT CONTRAST TECHNIQUE: Contiguous axial images were obtained from the base of the skull through the vertex without intravenous contrast. RADIATION DOSE REDUCTION: This exam was performed according to the departmental dose-optimization program  which includes automated exposure control, adjustment of the mA and/or kV according to patient size and/or use of iterative reconstruction technique. COMPARISON:  None Available. FINDINGS: Brain: No evidence of intracranial hemorrhage, acute infarction, hydrocephalus, extra-axial collection, or mass lesion/mass effect. Vascular:  No hyperdense vessel or other acute findings. Skull: No evidence of fracture or other significant bone abnormality. Sinuses/Orbits:  No acute findings. Other: None. IMPRESSION: Negative noncontrast head CT. Electronically Signed   By: Danae Orleans M.D.   On: 06/08/2023 20:08   DG Chest 2 View Result Date: 06/08/2023 CLINICAL DATA:  Loss of consciousness. Patient reports upper respiratory infection. EXAM: CHEST - 2 VIEW COMPARISON:  None Available. FINDINGS: The cardiomediastinal contours are normal. The lungs are clear. Pulmonary vasculature is normal. No consolidation, pleural effusion, or pneumothorax. Mild scoliotic curvature of the thoracic spine no acute osseous abnormalities are seen. IMPRESSION: No acute chest findings. Electronically Signed   By: Narda Rutherford M.D.   On: 06/08/2023 17:20    Procedures Procedures    Medications Ordered in ED Medications  potassium chloride SA (KLOR-CON M) CR tablet 40 mEq (40 mEq Oral Given 06/08/23 1652)  metoCLOPramide (REGLAN) injection 10 mg (10 mg Intravenous Given 06/08/23 1656)  diphenhydrAMINE (BENADRYL) injection 25 mg (25 mg Intravenous Given 06/08/23 1655)  ketorolac (TORADOL) 15 MG/ML injection 15 mg (15 mg Intravenous Given 06/08/23 1657)  sodium chloride 0.9 % bolus 1,000 mL (0 mLs Intravenous Stopped 06/08/23 2024)    ED Course/ Medical Decision Making/ A&P                                 Medical Decision Making Amount and/or Complexity of Data Reviewed Labs: ordered. Radiology: ordered.  Risk Prescription drug management.   This patient presents to the ED for concern of syncope, this involves an extensive  number of treatment options, and is a complaint that carries with it a high risk of complications and morbidity.  The differential diagnosis includes Vasovagal, orthostatic hypotension, arrhythmia, ACS, PE, dehydration, electrolyte derangement, medication side effect, drug intoxication/withdrawal, CVA, seizure, other   Co morbidities that complicate the patient evaluation  See HPI   Additional history obtained:  Additional history obtained from EMR External records from outside source obtained and reviewed including hospital records   Lab Tests:  I Ordered, and personally interpreted labs.  The pertinent results include: No leukocytosis.  Anemia with a hemoglobin 11.7.  Placed within range.  Hypokalemia, decreased bicarb of 2.9 and 18 respectively.  Hypokalemia of 7.9.  No renal dysfunction.  Troponin less than 2.  Magnesium 1.8.  Viral testing positive for flu A.  UA negative.  Urine pregnancy negative.   Imaging Studies ordered:  I ordered imaging studies including chest x-ray, CT head I independently visualized and interpreted imaging which showed  Chest x-ray: No acute cardiopulmonary abnormality. CT head: No acute intracranial abnormality. I agree with the radiologist interpretation   Cardiac Monitoring: / EKG:  The patient was maintained on a cardiac monitor.  I personally viewed and interpreted the cardiac monitored which showed an underlying rhythm of: Normal sinus rhythm, right axis deviation   Consultations Obtained:  N/a   Problem List / ED Course / Critical interventions / Medication management  Vasovagal syncope, headache, influenza a I ordered medication including Toradol, Reglan, Benadryl, testing:, Normal saline   Reevaluation of the patient after these medicines showed that the patient improved I have reviewed the patients home medicines and have made adjustments as needed   Social Determinants of Health:  Denies tobacco, illicit drug use.   Test /  Admission - Considered:  Vasovagal syncope, headache, influenza a Vitals signs within normal range and stable throughout visit. Laboratory/imaging studies significant for: See above 48 year old female presents emergency department with complaints of syncopal episode.  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  States that she subsequently collapsed to the ground and was lowered down to the floor.  States that she lost consciousness for a few seconds before regaining consciousness.  Her coworkers called EMS who gave her "sugar tablet" as well as some nausea medicine and repeat.  Patient states that she has been dealing with sinus pressure for the past couple of weeks.  States that she has been having chronic headache in her frontal region.  Denies any visual disturbance, gait abnormality, slurred speech, facial droop, weakness/sensory deficits in upper or lower extremities.  States that she was on antibiotics for a prior sinus infection around 4 weeks ago states that it helped some but never fully went away.  Denies any chest pain, shortness of breath, fevers, chills.  Patient does report that intermittent episodes of diarrhea over the past couple of weeks as well as a few episodes of vomiting; states she is concerned about dehydration as well. Regarding headache, nonfocal neuroexam.  No evidence clinically of meningismus.  Given patient's continued headache for the past 2 weeks, CT imaging obtained which was negative for any acute intracranial abnormality..  Migraine cocktail and noted resolution of symptoms.  Suspect patient's symptoms likely secondary to migraine headache. Regarding syncopal episode, episode seemingly likely related to vasovagal cause of syncope given feelings of flushed, lightheadedness prior to syncopal episode.  No postictal phase, tonic/clonic type movement, tongue biting,  bowel/bladder incontinence; low suspicion for seizure.  No evidence of arrhythmia on EKG or on patient's 6+ hours on cardiac monitoring while in the ED; low suspicion for arrhythmia.  Patient with negative troponin, lack of acute ischemic change on EKG; low suspicion for ACS.  Patient Wells PE 0 and PERC negative; low suspicion for PE.  Suspect patient's cause of syncope related to vasovagal syncope versus orthostatic hypotension. Worrisome signs and symptoms were discussed with the patient, and the patient acknowledged understanding to return to the ED if noticed. Patient was stable upon discharge.          Final Clinical Impression(s) / ED Diagnoses Final diagnoses:  Syncope, unspecified syncope type  Influenza A  Acute nonintractable headache, unspecified headache type    Rx / DC Orders ED Discharge Orders          Ordered    ondansetron (ZOFRAN-ODT) 4 MG disintegrating tablet  Every 8 hours PRN        06/08/23 2025              Peter Garter, [place] 06/08/23 2045    Virgina Norfolk, DO 06/08/23 2229

## 2023-06-08 NOTE — Discharge Instructions (Signed)
 As discussed, your workup today was overall reassuring.  Laboratory studies concerning for flu infection.  Suspect that this is causing respiratory as well as GI issues.  Will send home with nausea medicine to use as needed.  The CT scan of your head appeared normal.  Your EKG also appeared normal.  Suspect that you have vasovagal cause syncope or passing out.  Recommend adequate oral hydration in the outpatient setting.  Recommend follow-up with your primary care for reassessment.  Please do not hesitate to return if the worrisome signs and symptoms we discussed become apparent.

## 2023-06-08 NOTE — ED Triage Notes (Signed)
 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 Denies hitting head. States felt light head x 1 days. Recent dx of bronchitis.

## 2023-07-18 ENCOUNTER — Ambulatory Visit: Payer: Self-pay | Admitting: Neurology

## 2023-07-18 ENCOUNTER — Encounter: Payer: Self-pay | Admitting: Neurology

## 2023-07-18 VITALS — BP 122/79 | HR 71 | Ht 60.0 in | Wt 144.0 lb

## 2023-07-18 DIAGNOSIS — M79671 Pain in right foot: Secondary | ICD-10-CM

## 2023-07-18 MED ORDER — DULOXETINE HCL 30 MG PO CPEP: 30.0000 mg | ORAL_CAPSULE | Freq: Every day | ORAL | 3 refills | Status: AC

## 2023-07-18 NOTE — Patient Instructions (Addendum)
 Start duloxetine 30mg  daily  Nerve testing of the right leg  ELECTROMYOGRAM AND NERVE CONDUCTION STUDIES (EMG/NCS) INSTRUCTIONS  How to Prepare The neurologist conducting the EMG will need to know if you have certain medical conditions. Tell the neurologist and other EMG lab personnel if you: Have a pacemaker or any other electrical medical device Take blood-thinning medications Have hemophilia, a blood-clotting disorder that causes prolonged bleeding Bathing Take a shower or bath shortly before your exam in order to remove oils from your skin. Don't apply lotions or creams before the exam.  What to Expect You'll likely be asked to change into a hospital gown for the procedure and lie down on an examination table. The following explanations can help you understand what will happen during the exam.  Electrodes. The neurologist or a technician places surface electrodes at various locations on your skin depending on where you're experiencing symptoms. Or the neurologist may insert needle electrodes at different sites depending on your symptoms.  Sensations. The electrodes will at times transmit a tiny electrical current that you may feel as a twinge or spasm. The needle electrode may cause discomfort or pain that usually ends shortly after the needle is removed. If you are concerned about discomfort or pain, you may want to talk to the neurologist about taking a short break during the exam.  Instructions. During the needle EMG, the neurologist will assess whether there is any spontaneous electrical activity when the muscle is at rest - activity that isn't present in healthy muscle tissue - and the degree of activity when you slightly contract the muscle.  He or she will give you instructions on resting and contracting a muscle at appropriate times. Depending on what muscles and nerves the neurologist is examining, he or she may ask you to change positions during the exam.  After your EMG You may  experience some temporary, minor bruising where the needle electrode was inserted into your muscle. This bruising should fade within several days. If it persists, contact your primary care doctor.

## 2023-07-18 NOTE — Progress Notes (Signed)
 [facility] Clinic Note - Initial Visit   Date: 07/18/2023   Bailey Keller MRN: [id number] DOB: 1975-04-22   Dear Dr. Burna Mortimer:  Thank you for your kind referral of Bailey Keller for consultation of right foot pain. Although her history is well known to you, please allow [place] to reiterate it for the purpose of our medical record. The patient was accompanied to the clinic by self.   Bailey Keller is a 48 y.o. right-handed female with asthma presenting for evaluation of right foot pain.   IMPRESSION/PLAN: Chronic right foot pain, worse over the sole and heal of the foot.  Symptoms are not consistent with neuropathy.  NCS/EMG will be ordered to evaluate for S1 radiculopathy vs tarsal tunnel syndrome.  If this is normal, she may have chronic pain syndrome, such as reflex sympathetic dystrophy.   - NCS/EMG of the right leg  - Start duloxetine 30mg  daily  - Records requests from [facility] for imaging results  Further recommendations pending results.   ------------------------------------------------------------- History of present illness: Starting around 2020, she began having spells of right foot pain, involving the sole of the foot and heel.  Typically, he starts with swelling in the right foot with associated pain.  It generally lasts 4-5 days and self-resolves with treatment with NSAIDs.  Her most recent pain spell started in December after a fall down the steps.  Since this time, she has constant pain involving the sole of the foot.  Pain is throbbing, burning, sharp and constant.  She has tried heat, ice, NSAIDs, gabapentin with no relief.  Pain is present at rest or with activity.  She is unclear why she is falling.  She has fallen 6 times over the past 2 years.  She has had CT lumbar spine and CT brain which was normal.  CT of the foot showed fracture of the 4th toe for which she was immobilized.  She has previously seen  orthopeadics, sports medicine, and podiatry.    She is a [employer] administrator.  She lives at home with husband and three children.  Nonsmoker.  She does not drink.     Out-side paper records, electronic medical record, and images have been reviewed where available and summarized as:   History reviewed. No pertinent past medical history.  History reviewed. No pertinent surgical history.   Medications:  Outpatient Encounter Medications as of 07/18/2023  Medication Sig   albuterol (VENTOLIN HFA) 108 (90 Base) MCG/ACT inhaler Inhale into the lungs.   EPINEPHrine 0.3 mg/0.3 mL IJ SOAJ injection Inject into the muscle.   [DISCONTINUED] ondansetron (ZOFRAN-ODT) 4 MG disintegrating tablet Take 1 tablet (4 mg total) by mouth every 8 (eight) hours as needed. (Patient not taking: Reported on 07/18/2023)   No facility-administered encounter medications on file as of 07/18/2023.    Allergies:  Allergies  Allergen Reactions   Codeine Other (See Comments)    Hallucinations   Other Rash    Family History: Family History  Problem Relation Age of Onset   Hypertension Mother    Diabetes Father    Hypertension Maternal Grandmother    Hypertension Maternal Grandfather     Social History: Social History   Tobacco Use   Smoking status: Never   Smokeless tobacco: Never  Substance Use Topics   Alcohol use: Not Currently   Drug use: Not Currently   Social History   Social History Narrative   Are you right handed  or left handed? Right Handed    Are you currently employed ? 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 No    Who lives with you? Husband and 3 kids    What type of home do you live in: 1 story or 2 story? Lives in a two story home        Vital Signs:  BP 122/79   Pulse 71   Ht 5' (1.524 m)   Wt 144 lb (65.3 kg)   SpO2 100%   BMI 28.12 kg/m   Neurological Exam: MENTAL STATUS including orientation to  time, place, person, recent and remote memory, attention span and concentration, language, and fund of knowledge is normal.  Speech is not dysarthric.  CRANIAL NERVES: II:  No visual field defects.     III-IV-VI: Pupils equal round and reactive to light.  Normal conjugate, extra-ocular eye movements in all directions of gaze.  No nystagmus.  No ptosis.   V:  Normal facial sensation.    VII:  Normal facial symmetry and movements.   VIII:  Normal hearing and vestibular function.   IX-X:  Normal palatal movement.   XI:  Normal shoulder shrug and head rotation.   XII:  Normal tongue strength and range of motion, no deviation or fasciculation.  MOTOR:  No atrophy, fasciculations or abnormal movements.  No pronator drift.   Upper Extremity:  Right  Left  Deltoid  5/5   5/5   Biceps  5/5   5/5   Triceps  5/5   5/5   Wrist extensors  5/5   5/5   Wrist flexors  5/5   5/5   Finger extensors  5/5   5/5   Finger flexors  5/5   5/5   Dorsal interossei  5/5   5/5   Abductor pollicis  5/5   5/5   Tone (Ashworth scale)  0  0   Lower Extremity:  Right  Left  Hip flexors  5/5   5/5   Knee flexors  5/5   5/5   Knee extensors  5/5   5/5   Dorsiflexors  5/5   5/5   Plantarflexors  5/5   5/5   Toe extensors  5/5   5/5   Toe flexors  5/5   5/5   Tone (Ashworth scale)  0  0   MSRs:                                           Right        Left brachioradialis 2+  2+  biceps 2+  2+  triceps 2+  2+  patellar 2+  2+  ankle jerk 2+  2+  Hoffman no  no  plantar response down  down   SENSORY:  Normal and symmetric perception of light touch, pinprick, vibration, and temperature.    COORDINATION/GAIT: Normal finger-to- nose-finger.  Intact rapid alternating movements bilaterally.  Gait is antalgic, unassisted.     Thank you for allowing me to participate in patient's care.  If I can answer any additional questions, I would be pleased to do so.    Sincerely,    Andilyn Bettcher K. Allena Katz, DO

## 2023-08-12 ENCOUNTER — Ambulatory Visit: Admitting: Neurology

## 2023-08-12 DIAGNOSIS — M79671 Pain in right foot: Secondary | ICD-10-CM | POA: Diagnosis not present

## 2023-08-12 NOTE — Procedures (Addendum)
  [facility]  [address] [place], [address]  [place], [place] [address] Tel: [phone] Fax: [phone] Test Date:  08/12/2023  Patient: Bailey Keller DOB: 1975-10-06 Physician: Reyna Cava, DO  Sex: Female Height: 5\' 0"  Ref Phys: Reyna Cava, DO  ID#: [id number]   Technician:    History: This is a 48 year old female referred for evaluation of right foot pain.  NCV & EMG Findings: Extensive electrodiagnostic testing of the right lower extremity shows:  Right superficial peroneal, sural, medial plantar, and lateral plantar sensory responses are within normal limits. Right peroneal and tibial motor responses are within normal limits. Right tibial H reflex study is within normal limits. There is no evidence of active or chronic motor axonal loss changes affecting any of the tested muscles.  Motor unit configuration and recruitment pattern is within normal limits.  Impression: This is a normal study of the right lower extremity.  In particular, there is no evidence of a large fiber sensorimotor polyneuropathy, lumbosacral radiculopathy, or tarsal tunnel syndrome.   ___________________________ Reyna Cava, DO    Nerve Conduction Studies   Stim Site NR Peak (ms) Norm Peak (ms) O-P Amp (V) Norm O-P Amp  Right Sup Peroneal Anti Sensory (Ant Lat Mall)  32 C  12 cm    1.8 <4.5 10.5 >5  Right Sural Anti Sensory (Lat Mall)  32 C  Calf    2.3 <4.5 13.9 >5     Stim Site NR Onset (ms) Norm Onset (ms) O-P Amp (mV) Norm O-P Amp Site1 Site2 Delta-0 (ms) Dist (cm) Vel (m/s) Norm Vel (m/s)  Right Peroneal Motor (Ext Dig Brev)  32 C  Ankle    3.0 <5.5 7.0 >3 B Fib Ankle 6.6 35.0 53 >40  B Fib    9.6  6.6  Poplt B Fib 1.2 7.0 58 >40  Poplt    10.8  6.1         Right Tibial Motor (Abd Hall Brev)  32 C  Ankle    3.5 <6.0 13.5 >8 Knee Ankle 7.0 40.0 57 >40  Knee    10.5  13.2         Right Tibial (ADQP) Motor (ADQP)  32 C  Ankle    5.2 <6.5 4.5 >4 Knee Ankle 7.2 40.0  56 >40  Knee    12.4  3.8            Stim Site NR Peak (ms) Norm Peak (ms) P-T Amp (V) Norm P-T Amp  Right Lateral Plantar Mixed (Med Malleolus)  32 C  Lateral Foot    2.5 <3.7 8.4 >8  Right Medial Plantar Mixed (Med Malleolus)  32 C  Medial Foot    2.3 <3.7 10.1 >8   Electromyography   Side Muscle Ins.Act Fibs Fasc Recrt Amp Dur Poly Activation Comment  Right AntTibialis Nml Nml Nml Nml Nml Nml Nml Nml N/A  Right Gastroc Nml Nml Nml Nml Nml Nml Nml Nml N/A  Right Flex Dig Long Nml Nml Nml Nml Nml Nml Nml Nml N/A  Right RectFemoris Nml Nml Nml Nml Nml Nml Nml Nml N/A  Right BicepsFemS Nml Nml Nml Nml Nml Nml Nml Nml N/A  Right AbdHallucis Nml Nml Nml Nml Nml Nml Nml Nml N/A      Waveforms:
